# Patient Record
Sex: Female | Born: 1945 | Race: Black or African American | Hispanic: No | Marital: Married | Smoking: Former smoker
Health system: Southern US, Community
[De-identification: ages and names within clinical notes are randomized; demographics above are authoritative.]

## PROBLEM LIST (undated history)

## (undated) DIAGNOSIS — F32A Depression, unspecified: Secondary | ICD-10-CM

## (undated) DIAGNOSIS — E785 Hyperlipidemia, unspecified: Secondary | ICD-10-CM

## (undated) DIAGNOSIS — T7840XA Allergy, unspecified, initial encounter: Secondary | ICD-10-CM

## (undated) DIAGNOSIS — F329 Major depressive disorder, single episode, unspecified: Secondary | ICD-10-CM

## (undated) DIAGNOSIS — D219 Benign neoplasm of connective and other soft tissue, unspecified: Secondary | ICD-10-CM

## (undated) HISTORY — PX: TONSILLECTOMY: SUR1361

## (undated) HISTORY — DX: Allergy, unspecified, initial encounter: T78.40XA

## (undated) HISTORY — DX: Benign neoplasm of connective and other soft tissue, unspecified: D21.9

## (undated) HISTORY — DX: Hyperlipidemia, unspecified: E78.5

## (undated) HISTORY — DX: Major depressive disorder, single episode, unspecified: F32.9

## (undated) HISTORY — DX: Depression, unspecified: F32.A

---

## 2009-12-27 ENCOUNTER — Ambulatory Visit (HOSPITAL_BASED_OUTPATIENT_CLINIC_OR_DEPARTMENT_OTHER)
Admission: RE | Admit: 2009-12-27 | Discharge: 2009-12-27 | Payer: Self-pay | Source: Home / Self Care | Admitting: Obstetrics and Gynecology

## 2010-12-24 ENCOUNTER — Other Ambulatory Visit: Payer: Self-pay | Admitting: Obstetrics and Gynecology

## 2010-12-24 DIAGNOSIS — Z1231 Encounter for screening mammogram for malignant neoplasm of breast: Secondary | ICD-10-CM

## 2010-12-30 ENCOUNTER — Ambulatory Visit (HOSPITAL_BASED_OUTPATIENT_CLINIC_OR_DEPARTMENT_OTHER)
Admission: RE | Admit: 2010-12-30 | Discharge: 2010-12-30 | Disposition: A | Discharge status: Home / Self Care | Payer: Medicare Other | Source: Ambulatory Visit | Attending: Obstetrics and Gynecology | Admitting: Obstetrics and Gynecology

## 2010-12-30 DIAGNOSIS — Z1231 Encounter for screening mammogram for malignant neoplasm of breast: Secondary | ICD-10-CM | POA: Insufficient documentation

## 2011-01-15 ENCOUNTER — Other Ambulatory Visit (HOSPITAL_COMMUNITY)
Admission: RE | Admit: 2011-01-15 | Discharge: 2011-01-15 | Disposition: A | Discharge status: Home / Self Care | Payer: Medicare Other | Source: Ambulatory Visit | Attending: Obstetrics and Gynecology | Admitting: Obstetrics and Gynecology

## 2011-01-15 ENCOUNTER — Other Ambulatory Visit: Payer: Self-pay | Admitting: Obstetrics and Gynecology

## 2011-01-15 DIAGNOSIS — Z124 Encounter for screening for malignant neoplasm of cervix: Secondary | ICD-10-CM | POA: Insufficient documentation

## 2011-02-10 ENCOUNTER — Encounter: Payer: Self-pay | Admitting: Internal Medicine

## 2011-02-10 ENCOUNTER — Ambulatory Visit (INDEPENDENT_AMBULATORY_CARE_PROVIDER_SITE_OTHER): Payer: Medicare Other | Admitting: Internal Medicine

## 2011-02-10 VITALS — BP 117/65 | HR 54 | Temp 97.0°F | Resp 12 | Ht 65.25 in | Wt 150.1 lb

## 2011-02-10 DIAGNOSIS — D259 Leiomyoma of uterus, unspecified: Secondary | ICD-10-CM

## 2011-02-10 DIAGNOSIS — T7840XA Allergy, unspecified, initial encounter: Secondary | ICD-10-CM | POA: Insufficient documentation

## 2011-02-10 DIAGNOSIS — D219 Benign neoplasm of connective and other soft tissue, unspecified: Secondary | ICD-10-CM

## 2011-02-10 DIAGNOSIS — J309 Allergic rhinitis, unspecified: Secondary | ICD-10-CM

## 2011-02-10 DIAGNOSIS — F329 Major depressive disorder, single episode, unspecified: Secondary | ICD-10-CM

## 2011-02-10 DIAGNOSIS — E785 Hyperlipidemia, unspecified: Secondary | ICD-10-CM

## 2011-02-10 LAB — COMPREHENSIVE METABOLIC PANEL
ALT: 9 U/L (ref 0–35)
AST: 22 U/L (ref 0–37)
Albumin: 4.2 g/dL (ref 3.5–5.2)
Creat: 0.8 mg/dL (ref 0.50–1.10)
Potassium: 4.2 mEq/L (ref 3.5–5.3)

## 2011-02-10 LAB — LIPID PANEL
Cholesterol: 187 mg/dL (ref 0–200)
LDL Cholesterol: 123 mg/dL — ABNORMAL HIGH (ref 0–99)
Total CHOL/HDL Ratio: 3.5 Ratio
VLDL: 10 mg/dL (ref 0–40)

## 2011-02-10 LAB — CBC WITH DIFFERENTIAL/PLATELET
Basophils Absolute: 0 10*3/uL (ref 0.0–0.1)
Basophils Relative: 0 % (ref 0–1)
HCT: 37.4 % (ref 36.0–46.0)
Hemoglobin: 12.2 g/dL (ref 12.0–15.0)
Lymphocytes Relative: 49 % — ABNORMAL HIGH (ref 12–46)
Lymphs Abs: 1.5 10*3/uL (ref 0.7–4.0)
MCH: 29.8 pg (ref 26.0–34.0)
MCV: 91.2 fL (ref 78.0–100.0)
RBC: 4.1 MIL/uL (ref 3.87–5.11)
WBC: 3 10*3/uL — ABNORMAL LOW (ref 4.0–10.5)

## 2011-02-10 LAB — TSH: TSH: 2.166 u[IU]/mL (ref 0.350–4.500)

## 2011-02-10 NOTE — Progress Notes (Signed)
  Subjective:    Patient ID: Martha Walter, female    DOB: Feb 15, 1945, 66 y.o.   MRN: [id number]  HPI  New pt here for first visit.  Former primary care Dr. Della Goo.  GYN  Dr. Neva Seat.  PMH of mlld hyperlipidemia,  Uterine fibroids, allergic rhinitis and pt reports recently told she has cataracts.  Pt reports she is doing well.  She was on Pravastatin in the past and stopped med in 08/2010 as she does not like to take meds.  She is on fish oil now.  LDL 8/12 was 103 and total 178 with TG's 56.  She stopped Pravastatin shortly after blood drawn.  She is fasting today  No chest pain, no SOB,  No palpitations.  Mother had diabetes.  She is a non smoker.  Allergies  Allergen Reactions  . Keflex Itching  . Penicillins Itching   Past Medical History  Diagnosis Date  . Allergy   . Depression   . Hyperlipidemia   . Fibroids    Past Surgical History  Procedure Date  . Tonsillectomy    History   Social History  . Marital Status: Married    Spouse Name: N/A    Number of Children: N/A  . Years of Education: N/A   Occupational History  . Not on file.   Social History Main Topics  . Smoking status: Former Smoker    Quit date: 01/27/1963  . Smokeless tobacco: Never Used  . Alcohol Use: No  . Drug Use: No  . Sexually Active: Yes    Birth Control/ Protection: Post-menopausal   Other Topics Concern  . Not on file   Social History Narrative  . No narrative on file   Family History  Problem Relation Age of Onset  . Diabetes Mother   . Arthritis Mother   . Hypertension Mother     pulmonary HTN   Patient Active Problem List  Diagnoses  . Allergy  . Depression  . Hyperlipidemia  . Fibroids   No current outpatient prescriptions on file prior to visit.      Review of Systems see HPI   Objective:   Physical Exam Physical Exam  Nursing note and vitals reviewed.  Constitutional: She is oriented to person, place, and time. She appears well-developed and  well-nourished.  HENT:  Head: Normocephalic and atraumatic.  Cardiovascular: Normal rate and regular rhythm. Exam reveals no gallop and no friction rub.  No murmur heard.  Pulmonary/Chest: Breath sounds normal. She has no wheezes. She has no rales.  Neurological: She is alert and oriented to person, place, and time.  Skin: Skin is warm and dry.  Psychiatric: She has a normal mood and affect. Her behavior is normal.        Assessment & Plan:  1)  Mild Hyperlipidemia  Will check fasting labs and chemistries today with TSH 2)  Allergic rhinitis  Controlled with OTC loratidine 3) Uterine fibroids. 4)  H/o Depression  Treated with out meds. Situational after divorce

## 2011-02-10 NOTE — Patient Instructions (Signed)
Labs will be mailed to you .  Schedule CPE 

## 2011-02-12 ENCOUNTER — Encounter: Payer: Self-pay | Admitting: Emergency Medicine

## 2011-02-20 ENCOUNTER — Encounter: Payer: Self-pay | Admitting: Internal Medicine

## 2011-02-24 ENCOUNTER — Telehealth: Payer: Self-pay | Admitting: Hematology & Oncology

## 2011-02-24 ENCOUNTER — Encounter: Payer: Self-pay | Admitting: Internal Medicine

## 2011-02-24 ENCOUNTER — Ambulatory Visit (INDEPENDENT_AMBULATORY_CARE_PROVIDER_SITE_OTHER): Payer: Medicare Other | Admitting: Internal Medicine

## 2011-02-24 VITALS — BP 100/64 | HR 51 | Temp 97.2°F | Resp 12 | Ht 65.25 in | Wt 149.1 lb

## 2011-02-24 DIAGNOSIS — D72819 Decreased white blood cell count, unspecified: Secondary | ICD-10-CM | POA: Insufficient documentation

## 2011-02-24 DIAGNOSIS — D696 Thrombocytopenia, unspecified: Secondary | ICD-10-CM | POA: Insufficient documentation

## 2011-02-24 DIAGNOSIS — Z780 Asymptomatic menopausal state: Secondary | ICD-10-CM

## 2011-02-24 DIAGNOSIS — N951 Menopausal and female climacteric states: Secondary | ICD-10-CM

## 2011-02-24 DIAGNOSIS — J309 Allergic rhinitis, unspecified: Secondary | ICD-10-CM

## 2011-02-24 DIAGNOSIS — D229 Melanocytic nevi, unspecified: Secondary | ICD-10-CM

## 2011-02-24 DIAGNOSIS — D219 Benign neoplasm of connective and other soft tissue, unspecified: Secondary | ICD-10-CM

## 2011-02-24 DIAGNOSIS — D709 Neutropenia, unspecified: Secondary | ICD-10-CM

## 2011-02-24 DIAGNOSIS — E785 Hyperlipidemia, unspecified: Secondary | ICD-10-CM

## 2011-02-24 DIAGNOSIS — D259 Leiomyoma of uterus, unspecified: Secondary | ICD-10-CM

## 2011-02-24 DIAGNOSIS — D239 Other benign neoplasm of skin, unspecified: Secondary | ICD-10-CM

## 2011-02-24 LAB — POCT URINALYSIS DIPSTICK
Bilirubin, UA: NEGATIVE
Glucose, UA: NEGATIVE
Ketones, UA: NEGATIVE
Nitrite, UA: NEGATIVE
Urobilinogen, UA: NEGATIVE
pH, UA: 6

## 2011-02-24 LAB — CBC WITH DIFFERENTIAL/PLATELET
Basophils Absolute: 0 10*3/uL (ref 0.0–0.1)
HCT: 37.6 % (ref 36.0–46.0)
Hemoglobin: 12.3 g/dL (ref 12.0–15.0)
MCH: 29.9 pg (ref 26.0–34.0)
MCHC: 32.7 g/dL (ref 30.0–36.0)
Monocytes Absolute: 0.3 10*3/uL (ref 0.1–1.0)
Monocytes Relative: 9 % (ref 3–12)
Platelets: 153 10*3/uL (ref 150–400)
RBC: 4.12 MIL/uL (ref 3.87–5.11)
WBC: 3.2 10*3/uL — ABNORMAL LOW (ref 4.0–10.5)

## 2011-02-24 NOTE — Telephone Encounter (Signed)
Pt aware of new patient appointment 03-18-11

## 2011-02-24 NOTE — Patient Instructions (Signed)
Will set up appointment with hematologist Dr. Myna Hidalgo and dermatologist Dr. Vernell Barrier.    We will schedule bone density test   Follow Dash diet for mild elevated cholesterol  Call our office with prior vaccines

## 2011-02-24 NOTE — Progress Notes (Signed)
Subjective:    Patient ID: Martha Walter, female    DOB: 12-Mar-1945, 66 y.o.   MRN: [id number]  HPI Martha Walter is here for comprehensive evaluation.  Overall doing well .   She does have a dark nevus near buttock.  NOt sure how long it has been present .  Dr. Neva Seat did look at it during GYn exam.  Pap and guaiac per dr. Neva Seat.   See CBC.  Mild neutropenia and thrombocytopenia    Allergies  Allergen Reactions  . Aspirin   . Keflex Itching  . Penicillins Itching   Past Medical History  Diagnosis Date  . Allergy   . Depression   . Hyperlipidemia   . Fibroids    Past Surgical History  Procedure Date  . Tonsillectomy    History   Social History  . Marital Status: Married    Spouse Name: N/A    Number of Children: N/A  . Years of Education: N/A   Occupational History  . Not on file.   Social History Main Topics  . Smoking status: Former Smoker    Quit date: 01/27/1963  . Smokeless tobacco: Never Used  . Alcohol Use: No  . Drug Use: No  . Sexually Active: Yes    Birth Control/ Protection: Post-menopausal   Other Topics Concern  . Not on file   Social History Narrative  . No narrative on file   Family History  Problem Relation Age of Onset  . Diabetes Mother   . Arthritis Mother   . Hypertension Mother     pulmonary HTN   Patient Active Problem List  Diagnoses  . Allergy  . Depression  . Hyperlipidemia  . Fibroids  . Thrombocytopenia  . Neutropenia   Current Outpatient Prescriptions on File Prior to Visit  Medication Sig Dispense Refill  . ibuprofen (ADVIL,MOTRIN) 200 MG tablet Take 200 mg by mouth every 8 (eight) hours as needed.      . Loratadine 10 MG CAPS Take 1 capsule by mouth daily as needed.      . Omega-3 Fatty Acids (FISH OIL) 300 MG CAPS Take 2 capsules by mouth daily.           Review of Systems  Constitutional: Negative for chills, diaphoresis, appetite change and fatigue.  HENT: Negative for ear pain and tinnitus.     Respiratory: Negative for cough, chest tightness and shortness of breath.   Cardiovascular: Negative for chest pain, palpitations and leg swelling.  Gastrointestinal: Negative for abdominal pain and blood in stool.  Genitourinary: Negative for dysuria and difficulty urinating.  Musculoskeletal: Negative for back pain, joint swelling and gait problem.  Skin: Negative for rash.  Neurological: Negative for dizziness.  Hematological: Negative for adenopathy. Does not bruise/bleed easily.      Objective:   Physical Exam Physical Exam  Nursing note and vitals reviewed.  Constitutional: She is oriented to person, place, and time. She appears well-developed and well-nourished.  HENT:  Head: Normocephalic and atraumatic.  Right Ear: Tympanic membrane and ear canal normal. No drainage. Tympanic membrane is not injected and not erythematous.  Left Ear: Tympanic membrane and ear canal normal. No drainage. Tympanic membrane is not injected and not erythematous.  Nose: Nose normal. Right sinus exhibits no maxillary sinus tenderness and no frontal sinus tenderness. Left sinus exhibits no maxillary sinus tenderness and no frontal sinus tenderness.  Mouth/Throat: Oropharynx is clear and moist. No oral lesions. No oropharyngeal exudate.  Eyes: Conjunctivae and EOM are normal. Pupils  are equal, round, and reactive to light.  Neck: Normal range of motion. Neck supple. No JVD present. Carotid bruit is not present. No mass and no thyromegaly present.  Cardiovascular: Normal rate, regular rhythm, S1 normal, S2 normal and intact distal pulses. Exam reveals no gallop and no friction rub.  No murmur heard.  Pulses:  Carotid pulses are 2+ on the right side, and 2+ on the left side.  Dorsalis pedis pulses are 2+ on the right side, and 2+ on the left side.  No carotid bruit. No LE edema  Pulmonary/Chest: Breath sounds normal. She has no wheezes. She has no rales. She exhibits no tenderness. Breast no discrete  masses no nipple discharge no axillary adenopathy bilaerally. Abdominal: Soft. Bowel sounds are normal. She exhibits no distension and no mass. There is no hepatosplenomegaly. There is no tenderness. There is no CVA tenderness. Rectal done at GYN office  Musculoskeletal: Normal range of motion.  No active synovitis to joints.  Lymphadenopathy:  She has no cervical adenopathy.  She has no axillary adenopathy.  Right: No inguinal and no supraclavicular adenopathy present.  Left: No inguinal and no supraclavicular adenopathy present.  Neurological: She is alert and oriented to person, place, and time. She has normal strength and normal reflexes. She displays no tremor. No cranial nerve deficit or sensory deficit. Coordination and gait normal.  Skin: Skin is warm and dry. No rash noted. No cyanosis. Nails show no clubbing. Multiple nevi, few angiomas.  One very dark blackened nevus base of coccyx . Not sure how long it has been present Psychiatric: She has a normal mood and affect. Her speech is normal and behavior is normal. Cognition and memory are normal.           Assessment & Plan:  1)  Mild hyperlipidemia  DAsh diet given  EKG today borderline first degree AV block with sinus bradycardia 2)  Mild neutropenia,  Mild thrombocytopenia  Will recheck today but with 2 cell lines slightly low will get Hematology consult. With Dr. Myna Hidalgo 3)  Allergic rhinitis   Controlled 4)  Blackened nevus  Will refer to derm    I spent 45 minutes with this pt

## 2011-02-25 ENCOUNTER — Encounter: Payer: Self-pay | Admitting: Emergency Medicine

## 2011-02-25 ENCOUNTER — Telehealth: Payer: Self-pay | Admitting: Emergency Medicine

## 2011-02-25 DIAGNOSIS — Z139 Encounter for screening, unspecified: Secondary | ICD-10-CM

## 2011-02-25 NOTE — Telephone Encounter (Signed)
Martha Walter called and would like to go ahead with Shingles Vaccine.  The closest pharmacy to her home that offers the vaccine is [facility] in [place].  She is aware we will escribe the order and she can have the vaccine at her convenience.     Also updated her vaccine record as her last tetanus vaccination did include pertusis

## 2011-03-04 ENCOUNTER — Ambulatory Visit
Admission: RE | Admit: 2011-03-04 | Discharge: 2011-03-04 | Disposition: A | Discharge status: Home / Self Care | Payer: Medicare Other | Source: Ambulatory Visit | Attending: Internal Medicine | Admitting: Internal Medicine

## 2011-03-04 DIAGNOSIS — Z780 Asymptomatic menopausal state: Secondary | ICD-10-CM

## 2011-03-18 ENCOUNTER — Ambulatory Visit: Payer: Medicare Other

## 2011-03-18 ENCOUNTER — Ambulatory Visit (HOSPITAL_BASED_OUTPATIENT_CLINIC_OR_DEPARTMENT_OTHER): Payer: Medicare Other | Admitting: Hematology & Oncology

## 2011-03-18 ENCOUNTER — Other Ambulatory Visit (HOSPITAL_BASED_OUTPATIENT_CLINIC_OR_DEPARTMENT_OTHER): Payer: Medicare Other | Admitting: Lab

## 2011-03-18 VITALS — BP 116/65 | HR 55 | Temp 96.6°F | Ht 64.5 in | Wt 149.0 lb

## 2011-03-18 DIAGNOSIS — D72819 Decreased white blood cell count, unspecified: Secondary | ICD-10-CM

## 2011-03-18 LAB — CBC WITH DIFFERENTIAL (CANCER CENTER ONLY)
LYMPH#: 2 10*3/uL (ref 0.9–3.3)
LYMPH%: 51.6 % — ABNORMAL HIGH (ref 14.0–48.0)
MCH: 30.1 pg (ref 26.0–34.0)
MCHC: 33.4 g/dL (ref 32.0–36.0)
MONO#: 0.3 10*3/uL (ref 0.1–0.9)
MONO%: 7.1 % (ref 0.0–13.0)
NEUT%: 39.7 % (ref 39.6–80.0)
Platelets: 152 10*3/uL (ref 145–400)

## 2011-03-18 NOTE — Progress Notes (Signed)
This office note has been dictated.

## 2011-03-19 NOTE — Progress Notes (Signed)
DIAGNOSIS:  Leukopenia, likely chronic, benign.  HISTORY OF PRESENT ILLNESS:  Martha Walter is a very charming 66 year old African American female.  She has been in the [place] area for the past 2 years.  She originally was in [place].  She worked at [employer]. Her parents, I think, are here.  As such, she moved back.  She is retired now.  She is being seen now by Dr. Constance Goltz in our building.  Ms. Offner has been pretty healthy.  She does have a history of hyperlipidemia and fibroids.  She has not had any problem with infections.  She has had no change in bowel or bladder habits.  She has been getting her routine maintenance exams on a regular basis.  Her mammogram was done back in December.  She probably is due for a colonoscopy.  Dr. Constance Goltz did some lab work on her.  A CBC was done on 02/10/2011. This showed a white cell count of 3, hemoglobin 12.2, hematocrit 37.4 and platelet count 147.  White cell differential showed 40% segs, 49 lymphs and 10 monocytes.  She had relatively normal electrolytes.  Dr. Constance Goltz felt that a hematologic evaluation was indicated to make sure that there is no underlying blood disorder.  Martha Walter, again, feels fairly well.  She has not had any problem with fatigue or weakness.  She has not had any rashes.  There have been no joint aches or pains.  She has had no headache.  There is no double vision or blurred vision.  She has had no swallowing difficulties. There has been no foreign travel recently.  PAST MEDICAL HISTORY:  Remarkable for: 1. Depression. 2. Hyperlipidemia. 3. Uterine fibroids. 4. Status post tonsillectomy.  ALLERGIES: 1. Cephalosporins. 2. Penicillins.  MEDICATIONS:  Advil 200 mg p.o. q.8 hours p.r.n., Claritin 10 mg p.o. daily p.r.n., multivitamins daily and omega-3 fish oils daily.  SOCIAL HISTORY:  Remarkable for past tobacco use.  She stopped smoking back in 1965.  There is no history of alcohol use.  There is no  history of blood transfusion.  There are no risk factors for HIV or hepatitis.  FAMILY HISTORY:  Remarkable for: 1. Hypertension. 2. Diabetes. 3. Arthritis.  REVIEW OF SYSTEMS:  As stated in the history of present illness.  No additional findings noted on a 12-system review.  PHYSICAL EXAMINATION:  General:  This is a well-developed, well- nourished African American female in no obvious distress.  Vital Signs: 98.6, pulse 55, respiratory rate 18, blood pressure 116/65.  Weight is 149.  Head and Neck Exam:  Shows a normocephalic, atraumatic skull. There are no ocular or oral lesions.  There are no palpable cervical or supraclavicular lymph nodes.  Lungs:  Clear bilaterally.  Cardiac Exam: Regular rate and rhythm with normal S1 and S2.  There are no murmurs, rubs or bruits.  Abdominal Exam:  Soft with good bowel sounds.  There is no palpable abdominal mass.  There is no fluid wave.  There is no guarding or rebound tenderness.  There is no palpable hepatosplenomegaly.  Back Exam:  No tenderness over the spine, ribs or hips.  There is no kyphosis or osteoporotic changes.  Extremities:  Show no clubbing, cyanosis, or edema.  She has good range of motion of her joints.  Skin Exam:  No rashes, ecchymosis or petechiae.  Neurological Exam:  No focal neurological deficits.  LABORATORY STUDIES:  White cell count is 3.8, hemoglobin 12.2, hematocrit 36.5, platelet count 152.  MCV is 90.  Peripheral smear shows a normochromic, normocytic population of red blood cells.  There are no nucleated red blood cells.  There are no teardrop cells.  There are no schistocytes.  I see no polychromasia.  White cells are minimally decreased in number.  She has a slight increase in lymphocytes.  She has a few lymphocytes with large granules.  I see no hypersegmented polys. There are no blasts.  There are no immature myeloid or lymphoid forms. Platelets are adequate in number and size.  IMPRESSION:  [name]  is a very charming 66 year old African American female with leukopenia.  This is minimal.  She has a normal blood smear. Her physical exam is fairly unremarkable.  I have to believe that this is a chronic, benign-type leukopenia.  I do not see anything on her history or on her physical exam that would suggest an underlying bone marrow disorder.  Her blood smear was very unremarkable.  Her blood smear did not show me anything that would suggest an underlying marrow disorder.  She did have a couple of lymphocytes which did have some large granules; however, I do not think we need to run a flow cytometry on her blood to rule out large granular lymphocytic leukemia.  I believe that her leukopenia is more based on her African American ancestry.  This benign type of leukopenia goes back to the [place] days in [place], and Africans who survived malaria typically had lower white cells.  I do not see that we need to do a bone marrow test on Martha Walter.  I just do not believe that we are looking at any kind of blood disorder.  I do not see any restrictions that Ms. Dise needs to take with respect to medications that she can take or foods that she can eat.  We will go ahead and let Martha Walter go from the practice.  Again, everything looks great on her exam and on her blood smear.  I spent a good hour with Martha Walter.  I showed her her blood counts.  I explained to her my recommendations.  She was in agreement with my recommendations.  I told Martha Walter we would be more than happy to see her in the future if any problems do arise.  I did give her a prayer blanket, which she did appreciate.    ______________________________ Martha Walter, M.D. PRE/MEDQ  D:  03/18/2011  T:  03/19/2011  Job:  [id number]

## 2011-06-25 ENCOUNTER — Encounter: Payer: Self-pay | Admitting: Internal Medicine

## 2011-09-29 ENCOUNTER — Telehealth: Payer: Self-pay | Admitting: Internal Medicine

## 2011-09-29 NOTE — Telephone Encounter (Signed)
Pt would like to participate in an exercise program called 'Bootcamp'.... It is at [facility] [employer]... She needs a note saying she can participate.... Pt needs number is [phone] if there any questions.... Ad

## 2011-09-29 NOTE — Telephone Encounter (Signed)
Pt needs a note saying that it is all right  To participate in exercise class

## 2012-07-14 ENCOUNTER — Other Ambulatory Visit: Payer: Self-pay | Admitting: *Deleted

## 2012-07-14 DIAGNOSIS — Z139 Encounter for screening, unspecified: Secondary | ICD-10-CM

## 2012-07-15 ENCOUNTER — Ambulatory Visit (HOSPITAL_BASED_OUTPATIENT_CLINIC_OR_DEPARTMENT_OTHER)
Admission: RE | Admit: 2012-07-15 | Discharge: 2012-07-15 | Disposition: A | Discharge status: Home / Self Care | Payer: Medicare Other | Source: Ambulatory Visit | Attending: Internal Medicine | Admitting: Internal Medicine

## 2012-07-15 ENCOUNTER — Other Ambulatory Visit: Payer: Self-pay | Admitting: *Deleted

## 2012-07-15 DIAGNOSIS — Z139 Encounter for screening, unspecified: Secondary | ICD-10-CM

## 2012-07-15 DIAGNOSIS — Z1231 Encounter for screening mammogram for malignant neoplasm of breast: Secondary | ICD-10-CM | POA: Insufficient documentation

## 2012-07-15 DIAGNOSIS — F329 Major depressive disorder, single episode, unspecified: Secondary | ICD-10-CM

## 2012-07-15 DIAGNOSIS — E785 Hyperlipidemia, unspecified: Secondary | ICD-10-CM

## 2012-07-15 DIAGNOSIS — D696 Thrombocytopenia, unspecified: Secondary | ICD-10-CM

## 2012-07-15 LAB — COMPREHENSIVE METABOLIC PANEL
Albumin: 4 g/dL (ref 3.5–5.2)
CO2: 29 mEq/L (ref 19–32)
Calcium: 9.6 mg/dL (ref 8.4–10.5)
Chloride: 106 mEq/L (ref 96–112)
Creat: 1.03 mg/dL (ref 0.50–1.10)
Potassium: 4.2 mEq/L (ref 3.5–5.3)
Total Protein: 6.7 g/dL (ref 6.0–8.3)

## 2012-07-15 LAB — CBC WITH DIFFERENTIAL/PLATELET
Basophils Relative: 1 % (ref 0–1)
Eosinophils Relative: 2 % (ref 0–5)
HCT: 36.7 % (ref 36.0–46.0)
Hemoglobin: 12.8 g/dL (ref 12.0–15.0)
MCV: 86.6 fL (ref 78.0–100.0)
Monocytes Absolute: 0.3 10*3/uL (ref 0.1–1.0)
Neutro Abs: 1.6 10*3/uL — ABNORMAL LOW (ref 1.7–7.7)
Neutrophils Relative %: 40 % — ABNORMAL LOW (ref 43–77)
WBC: 3.9 10*3/uL — ABNORMAL LOW (ref 4.0–10.5)

## 2012-07-15 LAB — LIPID PANEL
Cholesterol: 210 mg/dL — ABNORMAL HIGH (ref 0–200)
HDL: 59 mg/dL (ref >39)
Total CHOL/HDL Ratio: 3.6 Ratio
Triglycerides: 56 mg/dL (ref <150)

## 2012-07-15 LAB — TSH: TSH: 2.633 u[IU]/mL (ref 0.350–4.500)

## 2012-07-15 NOTE — Progress Notes (Signed)
Labs only

## 2012-07-16 LAB — VITAMIN D 25 HYDROXY (VIT D DEFICIENCY, FRACTURES): Vit D, 25-Hydroxy: 40 ng/mL (ref 30–89)

## 2012-10-18 ENCOUNTER — Encounter: Payer: Self-pay | Admitting: Internal Medicine

## 2012-10-18 ENCOUNTER — Ambulatory Visit (INDEPENDENT_AMBULATORY_CARE_PROVIDER_SITE_OTHER): Payer: Medicare Other | Admitting: Internal Medicine

## 2012-10-18 VITALS — BP 120/60 | HR 54 | Temp 97.5°F | Resp 18 | Wt 156.0 lb

## 2012-10-18 DIAGNOSIS — D72819 Decreased white blood cell count, unspecified: Secondary | ICD-10-CM

## 2012-10-18 DIAGNOSIS — Z9289 Personal history of other medical treatment: Secondary | ICD-10-CM | POA: Insufficient documentation

## 2012-10-18 DIAGNOSIS — Z9189 Other specified personal risk factors, not elsewhere classified: Secondary | ICD-10-CM

## 2012-10-18 DIAGNOSIS — Z1151 Encounter for screening for human papillomavirus (HPV): Secondary | ICD-10-CM | POA: Insufficient documentation

## 2012-10-18 DIAGNOSIS — F329 Major depressive disorder, single episode, unspecified: Secondary | ICD-10-CM

## 2012-10-18 DIAGNOSIS — E785 Hyperlipidemia, unspecified: Secondary | ICD-10-CM

## 2012-10-18 DIAGNOSIS — Z139 Encounter for screening, unspecified: Secondary | ICD-10-CM

## 2012-10-18 DIAGNOSIS — Z23 Encounter for immunization: Secondary | ICD-10-CM

## 2012-10-18 DIAGNOSIS — Z124 Encounter for screening for malignant neoplasm of cervix: Secondary | ICD-10-CM | POA: Insufficient documentation

## 2012-10-18 LAB — POCT URINALYSIS DIPSTICK
Glucose, UA: NEGATIVE
Leukocytes, UA: NEGATIVE
Urobilinogen, UA: NEGATIVE

## 2012-10-18 LAB — LIPID PANEL
HDL: 61 mg/dL (ref >39)
Total CHOL/HDL Ratio: 3.2 Ratio
Triglycerides: 97 mg/dL (ref <150)
VLDL: 19 mg/dL (ref 0–40)

## 2012-10-18 NOTE — Progress Notes (Signed)
Subjective:    Patient ID: Martha Walter, female    DOB: Jan 01, 1946, 67 y.o.   MRN: [id number]  HPI Martha Walter is here for CPE.  Overall doing well.  She was evaluated by Dr. Myna Hidalgo for her leukopenia which was felt to be mild and genetic in nature  No recent infections  See EKG  She has known sinus bradycardia and tells me she had complete work up at [facility] which sounds like a holter eval which pt reports was fine  She tells me she had a sigmoidoscopy several years ago but has never had a colonoscopy. Last pap normal 2012  Dr. Neva Seat  Allergies  Allergen Reactions  . Aspirin   . Cephalexin Itching  . Penicillins Itching   Past Medical History  Diagnosis Date  . Allergy   . Depression   . Hyperlipidemia   . Fibroids    Past Surgical History  Procedure Laterality Date  . Tonsillectomy     History   Social History  . Marital Status: Married    Spouse Name: N/A    Number of Children: N/A  . Years of Education: N/A   Occupational History  . Not on file.   Social History Main Topics  . Smoking status: Former Smoker    Quit date: 01/27/1963  . Smokeless tobacco: Never Used  . Alcohol Use: No  . Drug Use: No  . Sexual Activity: Yes    Birth Control/ Protection: Post-menopausal   Other Topics Concern  . Not on file   Social History Narrative  . No narrative on file   Family History  Problem Relation Age of Onset  . Diabetes Mother   . Arthritis Mother   . Hypertension Mother     pulmonary HTN   Patient Active Problem List   Diagnosis Date Noted  . Thrombocytopenia 02/24/2011  . Neutropenia 02/24/2011  . Allergy   . Depression   . Hyperlipidemia   . Fibroids    Current Outpatient Prescriptions on File Prior to Visit  Medication Sig Dispense Refill  . Calcium Carbonate-Vitamin D (CALCIUM 600+D) 600-400 MG-UNIT per tablet Take 1 tablet by mouth 2 (two) times daily.      Marland Kitchen ibuprofen (ADVIL,MOTRIN) 200 MG tablet Take 200 mg by mouth every 8 (eight) hours as  needed.      . Loratadine 10 MG CAPS Take 1 capsule by mouth daily as needed.      . Multiple Vitamins-Minerals (MULTIVITAMIN WITH MINERALS) tablet Take 1 tablet by mouth daily.      . Omega-3 Fatty Acids (FISH OIL) 300 MG CAPS Take 2 capsules by mouth daily.       No current facility-administered medications on file prior to visit.       Review of Systems  Constitutional: Negative.   HENT: Negative.   Respiratory: Negative.   Cardiovascular: Negative.   Gastrointestinal: Negative.   All other systems reviewed and are negative.       Objective:   Physical Exam Physical Exam  Vital signs and nursing note reviewed  Constitutional: She is oriented to person, place, and time. She appears well-developed and well-nourished. She is cooperative.  HENT:  Head: Normocephalic and atraumatic.  Right Ear: Tympanic membrane normal.  Left Ear: Tympanic membrane normal.  Nose: Nose normal.  Mouth/Throat: Oropharynx is clear and moist and mucous membranes are normal. No oropharyngeal exudate or posterior oropharyngeal erythema.  Eyes: Conjunctivae and EOM are normal. Pupils are equal, round, and reactive to light.  Neck:  Neck supple. No JVD present. Carotid bruit is not present. No mass and no thyromegaly present.  Cardiovascular: Regular rhythm, normal heart sounds, intact distal pulses and normal pulses.  Exam reveals no gallop and no friction rub.   No murmur heard. Pulses:      Dorsalis pedis pulses are 2+ on the right side, and 2+ on the left side.  Pulmonary/Chest: Breath sounds normal. She has no wheezes. She has no rhonchi. She has no rales. Right breast exhibits no mass, no nipple discharge and no skin change. Left breast exhibits no mass, no nipple discharge and no skin change.  Abdominal: Soft. Bowel sounds are normal. She exhibits no distension and no mass. There is no hepatosplenomegaly. There is no tenderness. There is no CVA tenderness.  Genitourinary: Rectum normal, vagina  normal and uterus normal. Rectal exam shows no mass. Guaiac negative stool. No labial fusion. There is no lesion on the right labia. There is no lesion on the left labia. Cervix exhibits no motion tenderness. Right adnexum displays no mass, no tenderness and no fullness. Left adnexum displays no mass, no tenderness and no fullness. No erythema around the vagina.  Musculoskeletal:       No active synovitis to any joint.    Lymphadenopathy:       Right cervical: No superficial cervical adenopathy present.      Left cervical: No superficial cervical adenopathy present.       Right axillary: No pectoral and no lateral adenopathy present.       Left axillary: No pectoral and no lateral adenopathy present.      Right: No inguinal adenopathy present.       Left: No inguinal adenopathy present.  Neurological: She is alert and oriented to person, place, and time. She has normal strength and normal reflexes. No cranial nerve deficit or sensory deficit. She displays a negative Romberg sign. Coordination and gait normal.  Skin: Skin is warm and dry. No abrasion, no bruising, no ecchymosis and no rash noted. No cyanosis. Nails show no clubbing.  Psychiatric: She has a normal mood and affect. Her speech is normal and behavior is normal.          Assessment & Plan:  Health Maintenance   Dexa due 02/2013  .  Pap today flu vaccine today  See scanned sheet  Hyperlipidemia  Will recheck today  Leukopenia  Mild  Sinus bradycardia   See me as needed          Assessment & Plan:

## 2012-10-18 NOTE — Patient Instructions (Addendum)
Will refer to GI for screening colonoscopy  See me as needed

## 2012-10-19 ENCOUNTER — Encounter: Payer: Self-pay | Admitting: *Deleted

## 2012-10-20 ENCOUNTER — Other Ambulatory Visit (HOSPITAL_COMMUNITY)
Admission: RE | Admit: 2012-10-20 | Discharge: 2012-10-20 | Disposition: A | Discharge status: Home / Self Care | Payer: Medicare Other | Source: Ambulatory Visit | Attending: Internal Medicine | Admitting: Internal Medicine

## 2012-10-24 ENCOUNTER — Encounter: Payer: Self-pay | Admitting: *Deleted

## 2012-12-02 ENCOUNTER — Encounter: Payer: Self-pay | Admitting: *Deleted

## 2012-12-10 ENCOUNTER — Encounter: Payer: Self-pay | Admitting: Internal Medicine

## 2012-12-10 DIAGNOSIS — K635 Polyp of colon: Secondary | ICD-10-CM | POA: Insufficient documentation

## 2013-07-19 ENCOUNTER — Other Ambulatory Visit: Payer: Self-pay | Admitting: Internal Medicine

## 2013-07-19 ENCOUNTER — Ambulatory Visit (INDEPENDENT_AMBULATORY_CARE_PROVIDER_SITE_OTHER): Payer: Medicare Other | Admitting: Internal Medicine

## 2013-07-19 ENCOUNTER — Encounter: Payer: Self-pay | Admitting: Internal Medicine

## 2013-07-19 VITALS — BP 124/64 | HR 55 | Resp 16 | Ht 66.0 in | Wt 155.0 lb

## 2013-07-19 DIAGNOSIS — Z0000 Encounter for general adult medical examination without abnormal findings: Secondary | ICD-10-CM

## 2013-07-19 DIAGNOSIS — M25579 Pain in unspecified ankle and joints of unspecified foot: Secondary | ICD-10-CM

## 2013-07-19 DIAGNOSIS — Z139 Encounter for screening, unspecified: Secondary | ICD-10-CM

## 2013-07-19 DIAGNOSIS — M25571 Pain in right ankle and joints of right foot: Secondary | ICD-10-CM

## 2013-07-19 DIAGNOSIS — Z1231 Encounter for screening mammogram for malignant neoplasm of breast: Secondary | ICD-10-CM

## 2013-07-19 NOTE — Progress Notes (Signed)
Subjective:    Patient ID: Martha Walter, female    DOB: 1945/02/14, 68 y.o.   MRN: [id number]  HPI  Martha Walter is here for acute visit.   She has had 6 weeks  Of  Right foot pain in toes and sometimes in ankles.   She has notice toes of left foot  Are distorted.  No injury or trauma.  No redness or edema  Allergies  Allergen Reactions  . Aspirin   . Cephalexin Itching  . Penicillins Itching   Past Medical History  Diagnosis Date  . Allergy   . Depression   . Hyperlipidemia   . Fibroids    Past Surgical History  Procedure Laterality Date  . Tonsillectomy     History   Social History  . Marital Status: Married    Spouse Name: N/A    Number of Children: N/A  . Years of Education: N/A   Occupational History  . Not on file.   Social History Main Topics  . Smoking status: Former Smoker    Quit date: 01/27/1963  . Smokeless tobacco: Never Used  . Alcohol Use: No  . Drug Use: No  . Sexual Activity: Yes    Birth Control/ Protection: Post-menopausal   Other Topics Concern  . Not on file   Social History Narrative  . No narrative on file   Family History  Problem Relation Age of Onset  . Diabetes Mother   . Arthritis Mother   . Hypertension Mother     pulmonary HTN   Patient Active Problem List   Diagnosis Date Noted  . Hyperplastic colon polyp 12/10/2012  . H/O bone density study 10/18/2012  . Thrombocytopenia 02/24/2011  . Leukopenia 02/24/2011  . Allergy   . Depression   . Hyperlipidemia   . Fibroids    Current Outpatient Prescriptions on File Prior to Visit  Medication Sig Dispense Refill  . Calcium Carbonate-Vitamin D (CALCIUM 600+D) 600-400 MG-UNIT per tablet Take 1 tablet by mouth 2 (two) times daily.      . Flaxseed, Linseed, (FLAX SEEDS) POWD Take 1 capsule by mouth.      Marland Kitchen ibuprofen (ADVIL,MOTRIN) 200 MG tablet Take 200 mg by mouth every 8 (eight) hours as needed.      . Loratadine 10 MG CAPS Take 1 capsule by mouth daily as needed.      .  Multiple Vitamins-Minerals (MULTIVITAMIN WITH MINERALS) tablet Take 1 tablet by mouth daily.      . Omega-3 Fatty Acids (FISH OIL) 300 MG CAPS Take 2 capsules by mouth daily.       No current facility-administered medications on file prior to visit.      Review of Systems See HPI    Objective:   Physical Exam Physical Exam  Nursing note and vitals reviewed.  Constitutional: She is oriented to person, place, and time. She appears well-developed and well-nourished.  HENT:  Head: Normocephalic and atraumatic.  Cardiovascular: Normal rate and regular rhythm. Exam reveals no gallop and no friction rub.  No murmur heard.  Pulmonary/Chest: Breath sounds normal. She has no wheezes. She has no rales.  Neurological: She is alert and oriented to person, place, and time.  Skin: Skin is warm and dry.  Joints  Psychiatric: She has a normal mood and affect. Her behavior is normal.           Assessment & Plan:  Foot ankle/pain:  I offered xray today but pt would rather see a Podiatrist.  And have xrays done there.      Will set up referral

## 2013-07-24 ENCOUNTER — Ambulatory Visit (INDEPENDENT_AMBULATORY_CARE_PROVIDER_SITE_OTHER): Payer: Medicare Other | Admitting: Podiatry

## 2013-07-24 ENCOUNTER — Encounter: Payer: Self-pay | Admitting: Podiatry

## 2013-07-24 VITALS — BP 115/57 | HR 58 | Ht 65.0 in | Wt 155.0 lb

## 2013-07-24 DIAGNOSIS — M2040 Other hammer toe(s) (acquired), unspecified foot: Secondary | ICD-10-CM

## 2013-07-24 DIAGNOSIS — M659 Synovitis and tenosynovitis, unspecified: Secondary | ICD-10-CM

## 2013-07-24 DIAGNOSIS — M25571 Pain in right ankle and joints of right foot: Secondary | ICD-10-CM

## 2013-07-24 DIAGNOSIS — M25579 Pain in unspecified ankle and joints of unspecified foot: Secondary | ICD-10-CM

## 2013-07-24 DIAGNOSIS — M216X9 Other acquired deformities of unspecified foot: Secondary | ICD-10-CM

## 2013-07-24 DIAGNOSIS — M216X1 Other acquired deformities of right foot: Secondary | ICD-10-CM

## 2013-07-24 NOTE — Patient Instructions (Signed)
Seen for pain in right ankle. Noted of tight Achilles tendon and excess pronating foot. May benefit from Administrator. Will make Physical therapy referral to stretch Achilles tendon.  Right foot placed in ankle brace.  Return in 2 weeks.

## 2013-07-24 NOTE — Progress Notes (Signed)
Subjective: 68 year old female presents complaining of right ankle pain x 2 months. She is retired and not on feet much. She used to exercise at [employer].  Also have painful contracted toe 2-4 left and have difficulty bearing weight. She favors the left foot and put more weight on right.   Review of Systems - General ROS: negative for - chills, fatigue, fever, hot flashes, night sweats, sleep disturbance, weight gain or weight loss Ophthalmic ROS: Wears corrective glasses. Will be checked soon. ENT ROS: negative Allergy and Immunology ROS: Takes allergy pills. Seasonal allergies. Endocrine ROS: negative Breast ROS: negative for breast lumps Respiratory ROS: no cough, shortness of breath, or wheezing Cardiovascular ROS: no chest pain or dyspnea on exertion Gastrointestinal ROS: no abdominal pain, change in bowel habits, or black or bloody stools Genito-Urinary ROS: no dysuria, trouble voiding, or hematuria Musculoskeletal ROS: Most pain at the right ankle. Neurological ROS: no TIA or stroke symptoms Dermatological ROS: negative.  Objective: Dermatologic:  Distal clavi 3rd left. Mild flaky and peeling skin both plantar. Vascular:  All pedal pulses are palpable bilateral. Mild ankle edema right. Mild increase in temperature right ankle.  Neurologic:  All epicritic and tactile sensations grossly intact. Normal response to Vibratory and Monofilament sensory testing.  Normal DTR bilateral. Orthopedic:  Flattening arch L>R.  Tight Achilles tendon R>L. Excess STJ pronation L>R. Forefoot varus L>R. Contracted lesser digits 2-4 left with digital corn on 3rd.   Assessment: Compensatory rearfoot pronation right.  Ankle equinus right. Forefoot varus with STJ pronation bilateral. Tenosynovitis right ankle.  Plan: May benefit from Ankle brace and custom orthotics.  Ankle brace placed in right foot and ankle. Will make referral to Physical therapy to stretch Achilles  tendon on right foot.  May take Advil as needed.  Return in 2 weeks.

## 2013-07-24 NOTE — Addendum Note (Signed)
Addended by: Camelia Phenes on: 07/24/2013 04:35 PM   Modules accepted: Orders

## 2013-08-01 ENCOUNTER — Ambulatory Visit: Payer: Medicare Other | Attending: Podiatry | Admitting: Physical Therapy

## 2013-08-01 DIAGNOSIS — M25579 Pain in unspecified ankle and joints of unspecified foot: Secondary | ICD-10-CM | POA: Diagnosis not present

## 2013-08-01 DIAGNOSIS — R5381 Other malaise: Secondary | ICD-10-CM | POA: Insufficient documentation

## 2013-08-01 DIAGNOSIS — M2560 Stiffness of unspecified joint, not elsewhere classified: Secondary | ICD-10-CM | POA: Insufficient documentation

## 2013-08-01 DIAGNOSIS — M659 Synovitis and tenosynovitis, unspecified: Secondary | ICD-10-CM | POA: Insufficient documentation

## 2013-08-01 DIAGNOSIS — IMO0001 Reserved for inherently not codable concepts without codable children: Secondary | ICD-10-CM | POA: Insufficient documentation

## 2013-08-01 DIAGNOSIS — M2040 Other hammer toe(s) (acquired), unspecified foot: Secondary | ICD-10-CM | POA: Diagnosis not present

## 2013-08-01 DIAGNOSIS — M65979 Unspecified synovitis and tenosynovitis, unspecified ankle and foot: Secondary | ICD-10-CM | POA: Insufficient documentation

## 2013-08-01 DIAGNOSIS — M216X9 Other acquired deformities of unspecified foot: Secondary | ICD-10-CM | POA: Insufficient documentation

## 2013-08-03 ENCOUNTER — Ambulatory Visit: Payer: Medicare Other | Admitting: Physical Therapy

## 2013-08-03 DIAGNOSIS — IMO0001 Reserved for inherently not codable concepts without codable children: Secondary | ICD-10-CM | POA: Diagnosis not present

## 2013-08-07 ENCOUNTER — Ambulatory Visit (INDEPENDENT_AMBULATORY_CARE_PROVIDER_SITE_OTHER): Payer: Medicare Other | Admitting: Podiatry

## 2013-08-07 ENCOUNTER — Encounter: Payer: Self-pay | Admitting: Podiatry

## 2013-08-07 ENCOUNTER — Ambulatory Visit: Payer: Medicare Other | Admitting: Podiatry

## 2013-08-07 VITALS — BP 115/50 | HR 59 | Ht 65.0 in | Wt 155.0 lb

## 2013-08-07 DIAGNOSIS — M659 Synovitis and tenosynovitis, unspecified: Secondary | ICD-10-CM

## 2013-08-07 DIAGNOSIS — M25571 Pain in right ankle and joints of right foot: Secondary | ICD-10-CM

## 2013-08-07 DIAGNOSIS — M25579 Pain in unspecified ankle and joints of unspecified foot: Secondary | ICD-10-CM

## 2013-08-07 DIAGNOSIS — M79606 Pain in leg, unspecified: Secondary | ICD-10-CM | POA: Insufficient documentation

## 2013-08-07 DIAGNOSIS — M2040 Other hammer toe(s) (acquired), unspecified foot: Secondary | ICD-10-CM

## 2013-08-07 DIAGNOSIS — M79609 Pain in unspecified limb: Secondary | ICD-10-CM

## 2013-08-07 NOTE — Progress Notes (Signed)
Getting physical therapy to have Achilles tendon stretch and also does stretch at home. Ankle brace is helping a lot.  Overall feel better. Still need to wear brace and take Ibuprofen about two a day.  Also have problem with contracted lesser toes on left.   Objective:  Dermatologic: Distal clavi 3rd left.  Vascular: All pedal pulses are palpable bilateral.  Neurologic: All epicritic and tactile sensations grossly intact.  Orthopedic:  Flattening arch L>R.  Tight Achilles tendon R>L.  Excess STJ pronation L>R.  Forefoot varus L>R.  Contracted lesser digits 2-4 left with digital corn on 3rd, symptomatic. Radiographic examination reveal severe elevated first ray L>R, short first ray bilateral, increased lateral deviation of the Calcaneocuboid angle bilateral.   Assessment: Compensatory rearfoot pronation right.  Ankle equinus right.  Forefoot varus with STJ pronation bilateral.  Tenosynovitis right ankle, improving. Severe hammer toe 2-4 left >right. Left symptomatic. 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   Plan: Both feet casted for Orthotics. May benefit from Flexor tenotomy for painful hammer toes, 3rd and 4th left foot.

## 2013-08-07 NOTE — Patient Instructions (Signed)
Continue with stretch exercise and keep ankle brace as needed. May benefit from tenotomy(Release of toe tendon to allow toe to relax) of lesser toes left foot for severe hammer toe problem. Both feet casted for Orthotics. Will contact patient when orthotics are ready.

## 2013-08-08 ENCOUNTER — Ambulatory Visit: Payer: Medicare Other | Admitting: Rehabilitation

## 2013-08-08 ENCOUNTER — Other Ambulatory Visit: Payer: Self-pay | Admitting: Internal Medicine

## 2013-08-08 DIAGNOSIS — Z1231 Encounter for screening mammogram for malignant neoplasm of breast: Secondary | ICD-10-CM

## 2013-08-08 DIAGNOSIS — IMO0001 Reserved for inherently not codable concepts without codable children: Secondary | ICD-10-CM | POA: Diagnosis not present

## 2013-08-10 ENCOUNTER — Ambulatory Visit: Payer: Medicare Other | Admitting: Physical Therapy

## 2013-08-10 DIAGNOSIS — IMO0001 Reserved for inherently not codable concepts without codable children: Secondary | ICD-10-CM | POA: Diagnosis not present

## 2013-08-15 ENCOUNTER — Ambulatory Visit: Payer: Medicare Other | Admitting: Rehabilitation

## 2013-08-15 DIAGNOSIS — IMO0001 Reserved for inherently not codable concepts without codable children: Secondary | ICD-10-CM | POA: Diagnosis not present

## 2013-08-17 ENCOUNTER — Ambulatory Visit: Payer: Medicare Other | Admitting: Physical Therapy

## 2013-08-17 DIAGNOSIS — IMO0001 Reserved for inherently not codable concepts without codable children: Secondary | ICD-10-CM | POA: Diagnosis not present

## 2013-08-18 ENCOUNTER — Ambulatory Visit (HOSPITAL_BASED_OUTPATIENT_CLINIC_OR_DEPARTMENT_OTHER)
Admission: RE | Admit: 2013-08-18 | Discharge: 2013-08-18 | Disposition: A | Discharge status: Home / Self Care | Payer: Medicare Other | Source: Ambulatory Visit | Attending: Internal Medicine | Admitting: Internal Medicine

## 2013-08-18 DIAGNOSIS — Z1231 Encounter for screening mammogram for malignant neoplasm of breast: Secondary | ICD-10-CM | POA: Diagnosis present

## 2013-08-18 IMAGING — MG MM DIGITAL SCREENING BILAT W/ CAD
5 series · 5 of 5 positions shown · non-contrast
Comparison: Previous exam(s).

CLINICAL DATA: Screening.

EXAM:
DIGITAL SCREENING BILATERAL MAMMOGRAM WITH CAD

[R CC (1 of 2)]
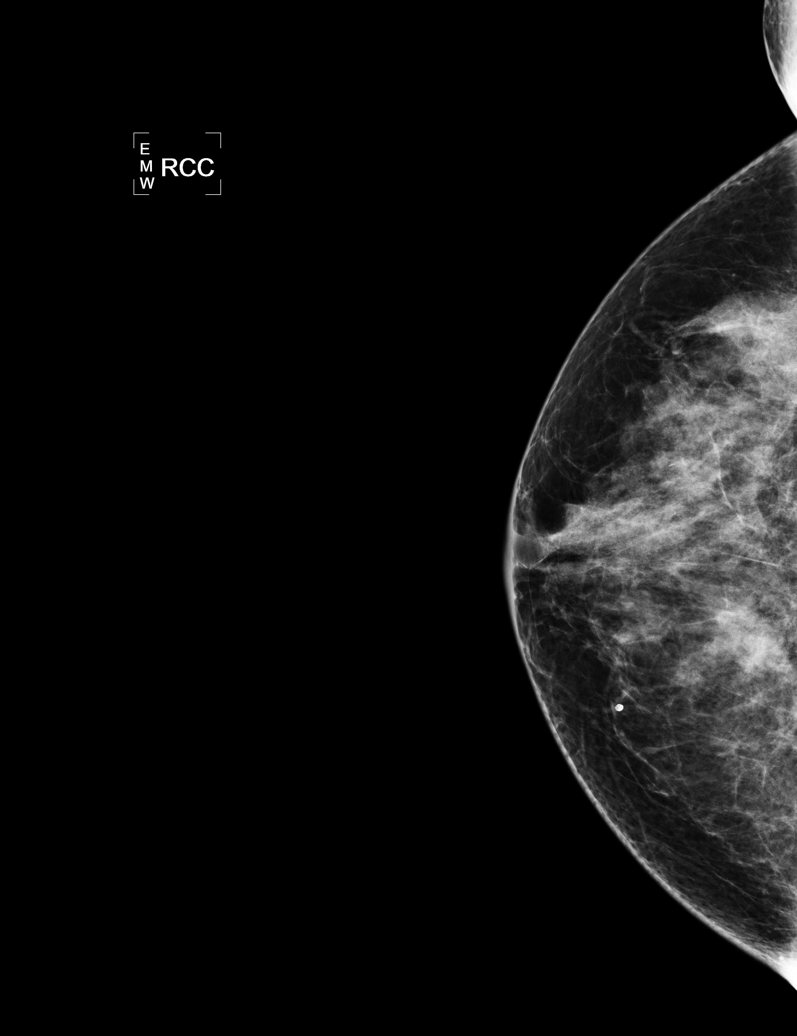

[L CC]
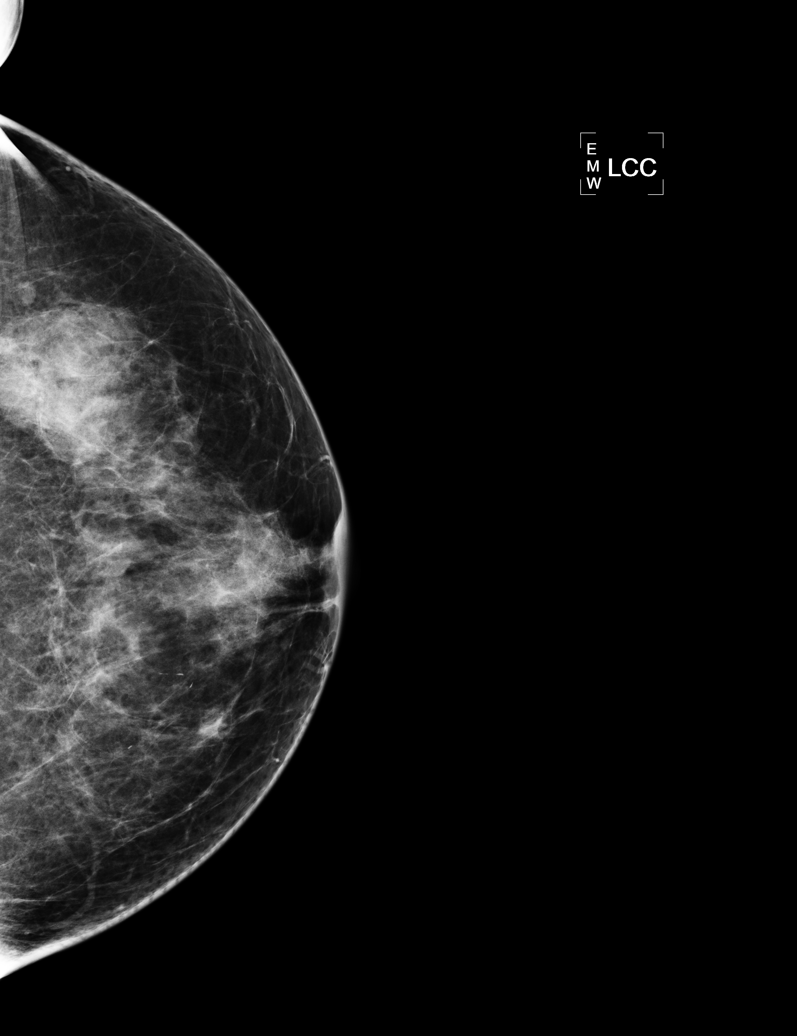

[L MLO]
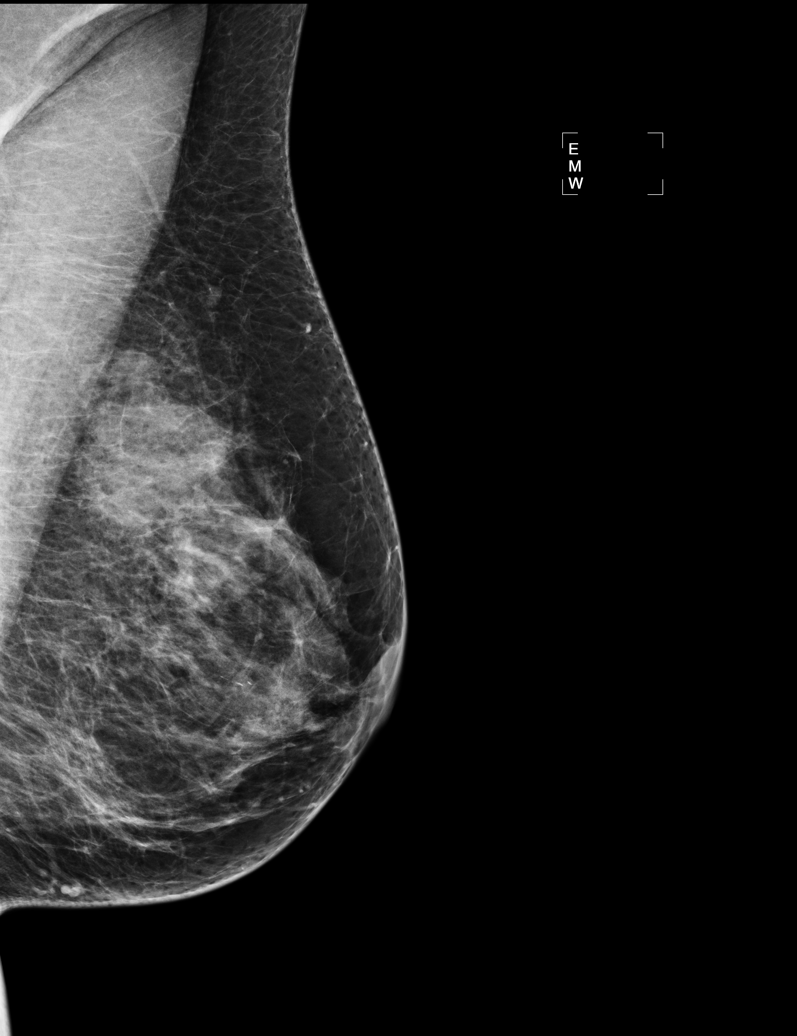

[R MLO]
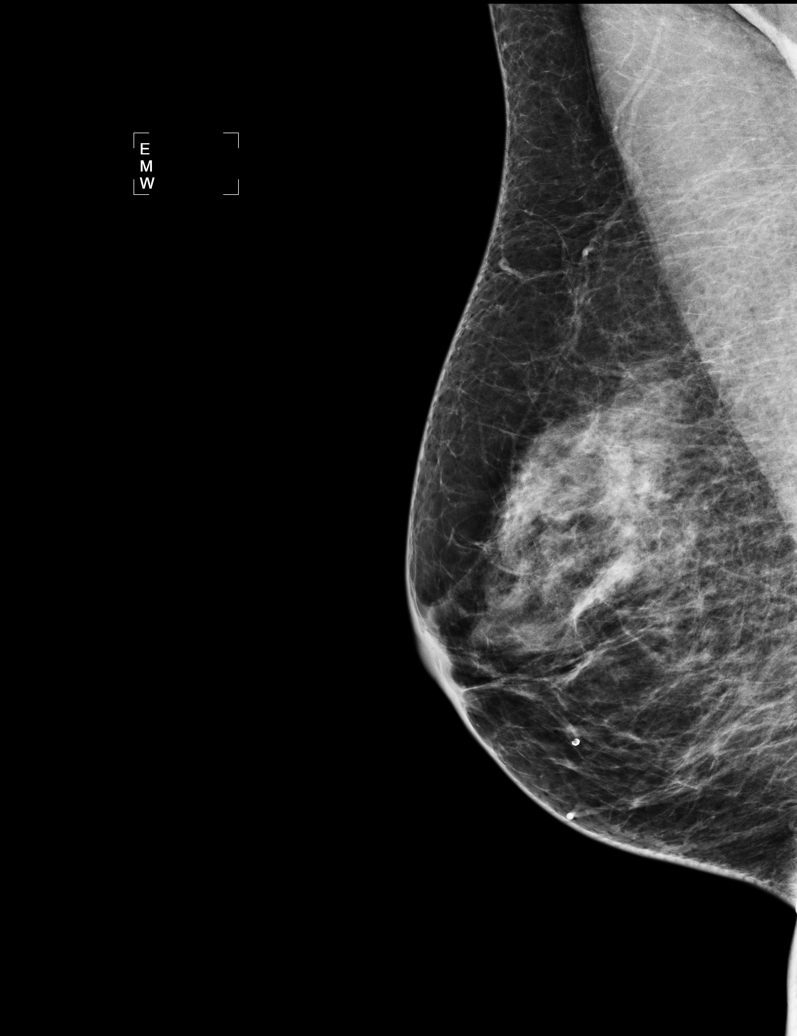

[R CC (2 of 2)]
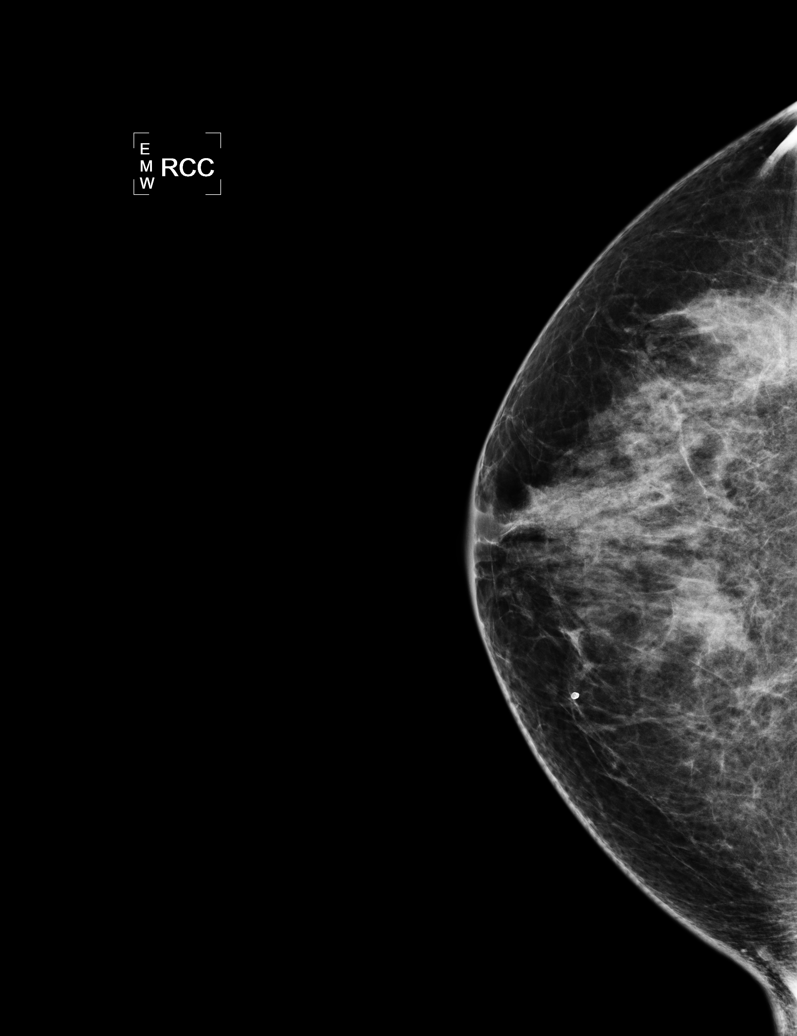

[5 of 5 positions shown; findings below may reference images not displayed]

ACR Breast Density Category d: The breast tissue is extremely dense,
which lowers the sensitivity of mammography.
FINDINGS: There are no findings suspicious for malignancy. Images were
processed with CAD.
IMPRESSION: No mammographic evidence of malignancy. A result letter of this
screening mammogram will be mailed directly to the patient.

RECOMMENDATION:
Screening mammogram in one year. (Code:[ZH])

BI-RADS CATEGORY  1: Negative.

## 2013-08-22 ENCOUNTER — Ambulatory Visit: Payer: Medicare Other | Admitting: Rehabilitation

## 2013-08-22 DIAGNOSIS — IMO0001 Reserved for inherently not codable concepts without codable children: Secondary | ICD-10-CM | POA: Diagnosis not present

## 2013-08-24 ENCOUNTER — Ambulatory Visit: Payer: Medicare Other | Admitting: Physical Therapy

## 2013-08-24 DIAGNOSIS — IMO0001 Reserved for inherently not codable concepts without codable children: Secondary | ICD-10-CM | POA: Diagnosis not present

## 2013-08-25 ENCOUNTER — Ambulatory Visit: Payer: Medicare Other | Admitting: Podiatry

## 2013-08-28 ENCOUNTER — Ambulatory Visit: Payer: Medicare Other | Attending: Podiatry | Admitting: Physical Therapy

## 2013-08-28 ENCOUNTER — Encounter: Payer: Self-pay | Admitting: Podiatry

## 2013-08-28 ENCOUNTER — Ambulatory Visit (INDEPENDENT_AMBULATORY_CARE_PROVIDER_SITE_OTHER): Payer: Medicare Other | Admitting: Podiatry

## 2013-08-28 VITALS — BP 109/58 | HR 52

## 2013-08-28 DIAGNOSIS — IMO0001 Reserved for inherently not codable concepts without codable children: Secondary | ICD-10-CM | POA: Insufficient documentation

## 2013-08-28 DIAGNOSIS — M216X9 Other acquired deformities of unspecified foot: Secondary | ICD-10-CM | POA: Diagnosis not present

## 2013-08-28 DIAGNOSIS — M2560 Stiffness of unspecified joint, not elsewhere classified: Secondary | ICD-10-CM | POA: Insufficient documentation

## 2013-08-28 DIAGNOSIS — M79605 Pain in left leg: Secondary | ICD-10-CM

## 2013-08-28 DIAGNOSIS — M79609 Pain in unspecified limb: Secondary | ICD-10-CM

## 2013-08-28 DIAGNOSIS — M25579 Pain in unspecified ankle and joints of unspecified foot: Secondary | ICD-10-CM | POA: Insufficient documentation

## 2013-08-28 DIAGNOSIS — R5381 Other malaise: Secondary | ICD-10-CM | POA: Insufficient documentation

## 2013-08-28 DIAGNOSIS — M2042 Other hammer toe(s) (acquired), left foot: Secondary | ICD-10-CM

## 2013-08-28 DIAGNOSIS — M2040 Other hammer toe(s) (acquired), unspecified foot: Secondary | ICD-10-CM | POA: Diagnosis not present

## 2013-08-28 DIAGNOSIS — M65979 Unspecified synovitis and tenosynovitis, unspecified ankle and foot: Secondary | ICD-10-CM | POA: Insufficient documentation

## 2013-08-28 DIAGNOSIS — M659 Synovitis and tenosynovitis, unspecified: Secondary | ICD-10-CM | POA: Insufficient documentation

## 2013-08-28 NOTE — Patient Instructions (Signed)
Questions answered for left foot hammer toe tendon procedure.  Ok to continue with exercise following the surgery.  Return as needed.

## 2013-08-28 NOTE — Progress Notes (Signed)
Patient presents to discuss left foot hammer toe tendon procedure. All questions answered. Doing physical therapy to stretch tight tendon. Patient is doing well and right ankle has less pain.  May leave out right ankle brace if pain is reduced through physical therapy. Patient will return for office procedure; Flexor tenotomy left foot 2nd and 3rd digits.

## 2013-08-30 ENCOUNTER — Ambulatory Visit (INDEPENDENT_AMBULATORY_CARE_PROVIDER_SITE_OTHER): Payer: Medicare Other | Admitting: Podiatry

## 2013-08-30 VITALS — BP 114/55 | HR 56 | Ht 66.0 in | Wt 153.0 lb

## 2013-08-30 DIAGNOSIS — M2042 Other hammer toe(s) (acquired), left foot: Secondary | ICD-10-CM

## 2013-08-30 DIAGNOSIS — M2040 Other hammer toe(s) (acquired), unspecified foot: Secondary | ICD-10-CM

## 2013-08-30 DIAGNOSIS — M79605 Pain in left leg: Secondary | ICD-10-CM

## 2013-08-30 DIAGNOSIS — M79609 Pain in unspecified limb: Secondary | ICD-10-CM

## 2013-08-30 NOTE — Progress Notes (Signed)
68 year old female presents for scheduled surgery left foot; flexor tenotomy 2-4. Consent form reviewed and explained possible complications.  Procedure done: Flexor tenotomy 2nd, 3rd, and 4th digit left foot.  Left 2, 3, 4th digit anesthetized with total 30ml of 50/50 mixture 0.5% marcaine with epinephrine and 1% Xylocaine plain. After left foot was cleansed with Iodine solution, plantar stab incision was placed under the sulcus of each toe and flexor tendon was release. Area was well flushed and closed with 4/0 Nylon. Dressed with betadine soaked wet to dry dressing. Placed in surgical shoe. Post op instructions given.  Return in one week.

## 2013-08-30 NOTE — Patient Instructions (Signed)
Office surgery flexor tendon release on 2nd, 3rd and 4th digit left foot. Keep dressing intact and take Advil if needed. Return in one week.

## 2013-08-31 ENCOUNTER — Ambulatory Visit: Payer: Medicare Other | Admitting: Rehabilitation

## 2013-08-31 DIAGNOSIS — IMO0001 Reserved for inherently not codable concepts without codable children: Secondary | ICD-10-CM | POA: Diagnosis not present

## 2013-09-06 ENCOUNTER — Ambulatory Visit (INDEPENDENT_AMBULATORY_CARE_PROVIDER_SITE_OTHER): Payer: Medicare Other | Admitting: Podiatry

## 2013-09-06 ENCOUNTER — Encounter: Payer: Self-pay | Admitting: Podiatry

## 2013-09-06 VITALS — BP 128/73 | HR 52

## 2013-09-06 DIAGNOSIS — M2042 Other hammer toe(s) (acquired), left foot: Secondary | ICD-10-CM

## 2013-09-06 DIAGNOSIS — M2040 Other hammer toe(s) (acquired), unspecified foot: Secondary | ICD-10-CM

## 2013-09-06 NOTE — Patient Instructions (Signed)
Doing well following tendon surgery. Sutures removed. Ok to get it wet. Keep compression bandage as needed.

## 2013-09-06 NOTE — Progress Notes (Signed)
1 week follow up since Flexor tenotomy 2-4 left foot. Good wound healing and dry. Sutures removed.  Ok to get wet.  Return in one week.

## 2013-09-07 ENCOUNTER — Ambulatory Visit: Payer: Medicare Other | Admitting: Physical Therapy

## 2013-09-07 DIAGNOSIS — IMO0001 Reserved for inherently not codable concepts without codable children: Secondary | ICD-10-CM | POA: Diagnosis not present

## 2013-09-11 ENCOUNTER — Ambulatory Visit: Payer: Medicare Other | Admitting: Physical Therapy

## 2013-09-11 DIAGNOSIS — IMO0001 Reserved for inherently not codable concepts without codable children: Secondary | ICD-10-CM | POA: Diagnosis not present

## 2013-09-13 ENCOUNTER — Ambulatory Visit (INDEPENDENT_AMBULATORY_CARE_PROVIDER_SITE_OTHER): Payer: Medicare Other | Admitting: Podiatry

## 2013-09-13 ENCOUNTER — Ambulatory Visit: Payer: Medicare Other | Admitting: Rehabilitation

## 2013-09-13 ENCOUNTER — Encounter: Payer: Self-pay | Admitting: Podiatry

## 2013-09-13 VITALS — BP 111/50 | HR 55

## 2013-09-13 DIAGNOSIS — Z9889 Other specified postprocedural states: Secondary | ICD-10-CM

## 2013-09-13 DIAGNOSIS — IMO0001 Reserved for inherently not codable concepts without codable children: Secondary | ICD-10-CM | POA: Diagnosis not present

## 2013-09-13 NOTE — Progress Notes (Signed)
Post op Tenotomy 2-4 left foot. Wound has healed well. All dry and well coapted.  Digits are straight without abnormal contracture. No edema or erythema noted. Wearing Ankle brace on right. Patient will start wearing Custom Orthotics now. Return in one month for orthotic check.

## 2013-09-13 NOTE — Patient Instructions (Signed)
Done well following tendon surgery. Wound healed well. Return in one month for Orthotic follow up.

## 2013-09-19 ENCOUNTER — Ambulatory Visit: Payer: Medicare Other | Admitting: Rehabilitation

## 2013-09-19 DIAGNOSIS — IMO0001 Reserved for inherently not codable concepts without codable children: Secondary | ICD-10-CM | POA: Diagnosis not present

## 2013-09-21 ENCOUNTER — Ambulatory Visit: Payer: Medicare Other | Admitting: Physical Therapy

## 2013-09-21 DIAGNOSIS — IMO0001 Reserved for inherently not codable concepts without codable children: Secondary | ICD-10-CM | POA: Diagnosis not present

## 2013-09-22 ENCOUNTER — Ambulatory Visit: Payer: Medicare Other | Admitting: Podiatry

## 2013-09-26 ENCOUNTER — Ambulatory Visit: Payer: Medicare Other | Attending: Podiatry | Admitting: Rehabilitation

## 2013-09-26 DIAGNOSIS — M2040 Other hammer toe(s) (acquired), unspecified foot: Secondary | ICD-10-CM | POA: Insufficient documentation

## 2013-09-26 DIAGNOSIS — M659 Synovitis and tenosynovitis, unspecified: Secondary | ICD-10-CM | POA: Insufficient documentation

## 2013-09-26 DIAGNOSIS — M216X9 Other acquired deformities of unspecified foot: Secondary | ICD-10-CM | POA: Diagnosis not present

## 2013-09-26 DIAGNOSIS — M65979 Unspecified synovitis and tenosynovitis, unspecified ankle and foot: Secondary | ICD-10-CM | POA: Insufficient documentation

## 2013-09-26 DIAGNOSIS — M25579 Pain in unspecified ankle and joints of unspecified foot: Secondary | ICD-10-CM | POA: Diagnosis not present

## 2013-09-26 DIAGNOSIS — M2560 Stiffness of unspecified joint, not elsewhere classified: Secondary | ICD-10-CM | POA: Insufficient documentation

## 2013-09-26 DIAGNOSIS — IMO0001 Reserved for inherently not codable concepts without codable children: Secondary | ICD-10-CM | POA: Insufficient documentation

## 2013-09-26 DIAGNOSIS — R5381 Other malaise: Secondary | ICD-10-CM | POA: Diagnosis not present

## 2013-09-28 ENCOUNTER — Ambulatory Visit: Payer: Medicare Other | Admitting: Rehabilitation

## 2013-09-28 DIAGNOSIS — IMO0001 Reserved for inherently not codable concepts without codable children: Secondary | ICD-10-CM | POA: Diagnosis not present

## 2013-10-04 ENCOUNTER — Ambulatory Visit: Payer: Medicare Other | Admitting: Rehabilitation

## 2013-10-04 DIAGNOSIS — IMO0001 Reserved for inherently not codable concepts without codable children: Secondary | ICD-10-CM | POA: Diagnosis not present

## 2013-10-09 ENCOUNTER — Ambulatory Visit: Payer: Medicare Other | Admitting: Physical Therapy

## 2013-10-10 ENCOUNTER — Ambulatory Visit: Payer: Medicare Other | Admitting: Physical Therapy

## 2013-10-11 ENCOUNTER — Ambulatory Visit: Payer: Medicare Other | Admitting: Physical Therapy

## 2013-10-11 DIAGNOSIS — IMO0001 Reserved for inherently not codable concepts without codable children: Secondary | ICD-10-CM | POA: Diagnosis not present

## 2013-10-13 ENCOUNTER — Encounter: Payer: Self-pay | Admitting: Podiatry

## 2013-10-13 ENCOUNTER — Ambulatory Visit (INDEPENDENT_AMBULATORY_CARE_PROVIDER_SITE_OTHER): Payer: Medicare Other | Admitting: Podiatry

## 2013-10-13 DIAGNOSIS — M659 Synovitis and tenosynovitis, unspecified: Secondary | ICD-10-CM

## 2013-10-13 NOTE — Progress Notes (Signed)
Doing much better. Been to PT and helping, and discharged. Orthotics are fine.  Continue do stretch exercise at home.  Return as needed.

## 2013-10-13 NOTE — Patient Instructions (Signed)
Doing well with orthotics and physical therapy. Continue with stretch exercise at home. Return as needed.

## 2013-10-17 ENCOUNTER — Ambulatory Visit: Payer: Medicare Other | Admitting: Physical Therapy

## 2013-10-19 ENCOUNTER — Ambulatory Visit: Payer: Medicare Other | Admitting: Rehabilitation

## 2013-10-24 ENCOUNTER — Ambulatory Visit: Payer: Medicare Other | Admitting: Physical Therapy

## 2013-11-27 ENCOUNTER — Encounter: Payer: Self-pay | Admitting: Podiatry

## 2014-01-01 ENCOUNTER — Other Ambulatory Visit: Payer: Self-pay | Admitting: *Deleted

## 2014-01-01 DIAGNOSIS — E559 Vitamin D deficiency, unspecified: Secondary | ICD-10-CM

## 2014-01-01 DIAGNOSIS — Z0000 Encounter for general adult medical examination without abnormal findings: Secondary | ICD-10-CM

## 2014-01-01 DIAGNOSIS — R5382 Chronic fatigue, unspecified: Secondary | ICD-10-CM

## 2014-01-02 LAB — LIPID PANEL
CHOL/HDL RATIO: 3.3 ratio
Cholesterol: 207 mg/dL — ABNORMAL HIGH (ref 0–200)
HDL: 63 mg/dL (ref >39)
LDL CALC: 132 mg/dL — AB (ref 0–99)
TRIGLYCERIDES: 59 mg/dL (ref <150)
VLDL: 12 mg/dL (ref 0–40)

## 2014-01-02 LAB — CBC WITH DIFFERENTIAL/PLATELET
Basophils Absolute: 0 10*3/uL (ref 0.0–0.1)
Basophils Relative: 1 % (ref 0–1)
EOS ABS: 0 10*3/uL (ref 0.0–0.7)
EOS PCT: 1 % (ref 0–5)
HCT: 36 % (ref 36.0–46.0)
HEMOGLOBIN: 12.6 g/dL (ref 12.0–15.0)
Lymphocytes Relative: 51 % — ABNORMAL HIGH (ref 12–46)
Lymphs Abs: 2 10*3/uL (ref 0.7–4.0)
MCH: 30.1 pg (ref 26.0–34.0)
MCHC: 35 g/dL (ref 30.0–36.0)
MCV: 86.1 fL (ref 78.0–100.0)
MPV: 11.1 fL (ref 9.4–12.4)
Monocytes Absolute: 0.3 10*3/uL (ref 0.1–1.0)
Monocytes Relative: 8 % (ref 3–12)
Neutro Abs: 1.6 10*3/uL — ABNORMAL LOW (ref 1.7–7.7)
Neutrophils Relative %: 39 % — ABNORMAL LOW (ref 43–77)
Platelets: 175 10*3/uL (ref 150–400)
RBC: 4.18 MIL/uL (ref 3.87–5.11)
RDW: 14.2 % (ref 11.5–15.5)
WBC: 4 10*3/uL (ref 4.0–10.5)

## 2014-01-02 LAB — COMPLETE METABOLIC PANEL WITH GFR
ALT: 20 U/L (ref 0–35)
AST: 23 U/L (ref 0–37)
Albumin: 3.9 g/dL (ref 3.5–5.2)
Alkaline Phosphatase: 58 U/L (ref 39–117)
BILIRUBIN TOTAL: 0.4 mg/dL (ref 0.2–1.2)
BUN: 16 mg/dL (ref 6–23)
CHLORIDE: 104 meq/L (ref 96–112)
CO2: 26 mEq/L (ref 19–32)
CREATININE: 0.94 mg/dL (ref 0.50–1.10)
Calcium: 9 mg/dL (ref 8.4–10.5)
GFR, EST AFRICAN AMERICAN: 72 mL/min
GFR, EST NON AFRICAN AMERICAN: 63 mL/min
GLUCOSE: 91 mg/dL (ref 70–99)
Potassium: 4.5 mEq/L (ref 3.5–5.3)
Sodium: 140 mEq/L (ref 135–145)
Total Protein: 6.5 g/dL (ref 6.0–8.3)

## 2014-01-02 LAB — TSH: TSH: 1.853 u[IU]/mL (ref 0.350–4.500)

## 2014-01-03 LAB — VITAMIN D 25 HYDROXY (VIT D DEFICIENCY, FRACTURES): Vit D, 25-Hydroxy: 37 ng/mL (ref 30–100)

## 2014-01-04 ENCOUNTER — Encounter: Payer: Self-pay | Admitting: Internal Medicine

## 2014-01-04 ENCOUNTER — Ambulatory Visit (INDEPENDENT_AMBULATORY_CARE_PROVIDER_SITE_OTHER): Payer: Medicare Other | Admitting: Internal Medicine

## 2014-01-04 VITALS — BP 115/61 | HR 54 | Resp 16 | Ht 66.0 in | Wt 156.0 lb

## 2014-01-04 DIAGNOSIS — Z1211 Encounter for screening for malignant neoplasm of colon: Secondary | ICD-10-CM

## 2014-01-04 DIAGNOSIS — D72819 Decreased white blood cell count, unspecified: Secondary | ICD-10-CM

## 2014-01-04 DIAGNOSIS — Z0189 Encounter for other specified special examinations: Secondary | ICD-10-CM

## 2014-01-04 DIAGNOSIS — Z0000 Encounter for general adult medical examination without abnormal findings: Secondary | ICD-10-CM

## 2014-01-04 DIAGNOSIS — Z23 Encounter for immunization: Secondary | ICD-10-CM

## 2014-01-04 DIAGNOSIS — E785 Hyperlipidemia, unspecified: Secondary | ICD-10-CM

## 2014-01-04 LAB — POCT URINALYSIS DIPSTICK
Bilirubin, UA: NEGATIVE
Blood, UA: NEGATIVE
GLUCOSE UA: NEGATIVE
Ketones, UA: NEGATIVE
Leukocytes, UA: NEGATIVE
NITRITE UA: NEGATIVE
PROTEIN UA: NEGATIVE
SPEC GRAV UA: 1.01
UROBILINOGEN UA: NEGATIVE
pH, UA: 6.5

## 2014-01-04 LAB — HEMOCCULT GUIAC POC 1CARD (OFFICE)
Card #1 Date: 12102015
FECAL OCCULT BLD: NEGATIVE

## 2014-01-04 NOTE — Progress Notes (Signed)
Subjective:    Patient ID: Martha Walter, female    DOB: Sep 24, 1945, 68 y.o.   MRN: [id number]  HPI  09/2012  Dexa due 02/2013 . Pap today flu vaccine today See scanned sheet  Hyperlipidemia Will recheck today  Leukopenia Mild  Sinus bradycardia   See me as needed  Today  Martha Walter is here for CPE:  HM  UTD with mm, colonoscopy and pap.  She smoked rarely first year of college  Less than 1 year   Normal Dexa 2013  Doing well  Hyperlipidemia  She declines statin   Allergies  Allergen Reactions  . Aspirin   . Cephalexin Itching  . Penicillins Itching   Past Medical History  Diagnosis Date  . Allergy   . Depression   . Hyperlipidemia   . Fibroids    Past Surgical History  Procedure Laterality Date  . Tonsillectomy     History   Social History  . Marital Status: Married    Spouse Name: N/A    Number of Children: N/A  . Years of Education: N/A   Occupational History  . Not on file.   Social History Main Topics  . Smoking status: Former Smoker    Quit date: 01/27/1963  . Smokeless tobacco: Never Used  . Alcohol Use: No  . Drug Use: No  . Sexual Activity: Yes    Birth Control/ Protection: Post-menopausal   Other Topics Concern  . Not on file   Social History Narrative   Family History  Problem Relation Age of Onset  . Diabetes Mother   . Arthritis Mother   . Hypertension Mother     pulmonary HTN   Patient Active Problem List   Diagnosis Date Noted  . Status post foot surgery 09/13/2013  . Hammer toe 08/07/2013  . Pain in lower limb 08/07/2013  . Right ankle pain 07/24/2013  . Other hammer toe (acquired) 07/24/2013  . Tenosynovitis of foot and ankle 07/24/2013  . Hyperplastic colon polyp 12/10/2012  . H/O bone density study 10/18/2012  . Thrombocytopenia 02/24/2011  . Leukopenia 02/24/2011  . Allergy   . Depression   . Hyperlipidemia   . Fibroids    Current Outpatient Prescriptions on File Prior to Visit  Medication Sig Dispense  Refill  . acetaminophen (TYLENOL) 500 MG tablet Take 500 mg by mouth every 6 (six) hours as needed.    . Calcium Carbonate-Vitamin D (CALCIUM 600+D) 600-400 MG-UNIT per tablet Take 1 tablet by mouth 2 (two) times daily.    . Flaxseed, Linseed, (FLAX SEEDS) POWD Take 1 capsule by mouth.    Marland Kitchen ibuprofen (ADVIL,MOTRIN) 200 MG tablet Take 200 mg by mouth every 8 (eight) hours as needed.    . Loratadine 10 MG CAPS Take 1 capsule by mouth daily as needed.    . Multiple Vitamins-Minerals (MULTIVITAMIN WITH MINERALS) tablet Take 1 tablet by mouth daily.    . Omega-3 Fatty Acids (FISH OIL) 300 MG CAPS Take 2 capsules by mouth daily.     No current facility-administered medications on file prior to visit.      Review of Systems  Respiratory: Negative for cough, chest tightness, shortness of breath and wheezing.   Cardiovascular: Negative for chest pain, palpitations and leg swelling.  Gastrointestinal: Negative for abdominal pain.       Objective:   Physical Exam  Physical Exam  Nursing note and vitals reviewed.  Constitutional: She is oriented to person, place, and time. She appears well-developed and well-nourished.  HENT:  Head: Normocephalic and atraumatic.  Right Ear: Tympanic membrane and ear canal normal. No drainage. Tympanic membrane is not injected and not erythematous.  Left Ear: Tympanic membrane and ear canal normal. No drainage. Tympanic membrane is not injected and not erythematous.  Nose: Nose normal. Right sinus exhibits no maxillary sinus tenderness and no frontal sinus tenderness. Left sinus exhibits no maxillary sinus tenderness and no frontal sinus tenderness.  Mouth/Throat: Oropharynx is clear and moist. No oral lesions. No oropharyngeal exudate.  Eyes: Conjunctivae and EOM are normal. Pupils are equal, round, and reactive to light.  Neck: Normal range of motion. Neck supple. No JVD present. Carotid bruit is not present. No mass and no thyromegaly present.    Cardiovascular: Normal rate, regular rhythm, S1 normal, S2 normal and intact distal pulses. Exam reveals no gallop and no friction rub.  No murmur heard.  Pulses:  Carotid pulses are 2+ on the right side, and 2+ on the left side.  Dorsalis pedis pulses are 2+ on the right side, and 2+ on the left side.  No carotid bruit. No LE edema  Pulmonary/Chest: Breath sounds normal. She has no wheezes. She has no rales. She exhibits no tenderness.  Abdominal: Soft. Bowel sounds are normal. She exhibits no distension and no mass. There is no hepatosplenomegaly. There is no tenderness. There is no CVA tenderness.  Musculoskeletal: Normal range of motion.  No active synovitis to joints.  Lymphadenopathy:  She has no cervical adenopathy.  She has no axillary adenopathy.  Right: No inguinal and no supraclavicular adenopathy present.  Left: No inguinal and no supraclavicular adenopathy present.  Neurological: She is alert and oriented to person, place, and time. She has normal strength and normal reflexes. She displays no tremor. No cranial nerve deficit or sensory deficit. Coordination and gait normal.  Skin: Skin is warm and dry. No rash noted. No cyanosis. Nails show no clubbing.  Psychiatric: She has a normal mood and affect. Her speech is normal and behavior is normal. Cognition and memory are normal.          Assessment & Plan:  HM:    Advised Prevnar will order  DExa adn mm next year.  Counseled lung ca screening pt does not meet criteria  Flu vaccine given today   Hyperlipidemia  She really does not wish a statin  LDL higher this year.  Will recheck if continues to rise will need statin.  She   Minimally low platelets  Colon polyp  Managed GI   See me as needed

## 2014-01-04 NOTE — Patient Instructions (Signed)
See me as needed 

## 2014-01-05 ENCOUNTER — Encounter: Payer: Self-pay | Admitting: *Deleted

## 2014-02-01 ENCOUNTER — Ambulatory Visit (INDEPENDENT_AMBULATORY_CARE_PROVIDER_SITE_OTHER): Payer: Medicare Other | Admitting: *Deleted

## 2014-02-01 DIAGNOSIS — Z23 Encounter for immunization: Secondary | ICD-10-CM

## 2015-01-08 ENCOUNTER — Encounter: Payer: Medicare Other | Admitting: Internal Medicine
